# Patient Record
Sex: Male | Born: 1985 | Hispanic: Yes | Marital: Married | Smoking: Current every day smoker
Health system: Southern US, Community
[De-identification: ages and names within clinical notes are randomized; demographics above are authoritative.]

## PROBLEM LIST (undated history)

## (undated) HISTORY — PX: SHOULDER SURGERY: SHX246

---

## 2006-07-24 ENCOUNTER — Emergency Department (HOSPITAL_COMMUNITY): Admission: EM | Admit: 2006-07-24 | Discharge: 2006-07-24 | Payer: Self-pay | Admitting: Emergency Medicine

## 2006-07-30 ENCOUNTER — Ambulatory Visit (HOSPITAL_COMMUNITY): Admission: RE | Admit: 2006-07-30 | Discharge: 2006-07-30 | Payer: Self-pay | Admitting: Orthopedic Surgery

## 2006-11-19 ENCOUNTER — Ambulatory Visit (HOSPITAL_BASED_OUTPATIENT_CLINIC_OR_DEPARTMENT_OTHER): Admission: RE | Admit: 2006-11-19 | Discharge: 2006-11-19 | Payer: Self-pay | Admitting: Orthopedic Surgery

## 2008-12-05 ENCOUNTER — Emergency Department (HOSPITAL_COMMUNITY): Admission: EM | Admit: 2008-12-05 | Discharge: 2008-12-05 | Payer: Self-pay | Admitting: Emergency Medicine

## 2009-01-08 ENCOUNTER — Emergency Department (HOSPITAL_COMMUNITY): Admission: EM | Admit: 2009-01-08 | Discharge: 2009-01-08 | Payer: Self-pay | Admitting: Emergency Medicine

## 2010-09-03 NOTE — Op Note (Signed)
NAMEBRIANA, [name]            ACCOUNT NO.:  [id number]   MEDICAL RECORD NO.:  [id number]          PATIENT TYPE:  AMB   LOCATION:  DSC                          FACILITY:  [facility]   PHYSICIAN:  Rodney A. Mortenson, M.D.DATE OF BIRTH:  03-02-1986   DATE OF PROCEDURE:  11/19/2006  DATE OF DISCHARGE:                               OPERATIVE REPORT   JUSTIFICATION:  25 year old male sustained a comminuted fractures of his  right distal clavicle.  He had a Rockwood pin inserted.  The clavicle  has now healed.  He is here for removal of the pin from the right  clavicle.  The complications were discussed preoperatively.  Questions  were answered and encouraged.   JUSTIFICATION FOR OUTPATIENT SURGERY:  Minimal morbidity.   PREOPERATIVE DIAGNOSIS:  Intramedullary nail right clavicle with healed  fracture clavicle.   POSTOPERATIVE DIAGNOSIS:  Intramedullary nail right clavicle with healed  fracture clavicle.   OPERATION:  Removal intramedullary nail, right clavicle.   SURGEON:  Lenard Galloway. Chaney Malling, M.D.   ANESTHESIA:  General.   PROCEDURE:  The patient was placed on the operating table in the supine  position.  After satisfactory general anesthesia, he was placed in the  semi-sitting position.  The right shoulder was prepped with DuraPrep and  draped out in the usual manner.  The C-arm was used.  Incision was made  in the posterior aspect of the shoulder where the IM rod exited the  posterior aspect of the clavicle.  The C-arm was used to identify and  locate the tip of the IM rod.  Dissection was carried down to this  level.  Bleeders were coagulated.  The IM rod was then found and the T-  handled extractor was applied.  The pin was then backed out its full  length without complications.  The subcutaneous tissue was closed with 2-  0 Vicryl and the skin closed with stainless steel staples.  A sterile  dressing was applied.  The patient returned to the recovery room in  excellent  condition.  Technically, he did extremely well.   DISPOSITION:  1. Darvocet for pain.  2. Usual postop instructions.  3. Return to my office on Wednesday.           ______________________________  Lenard Galloway. Chaney Malling, M.D.     RAM/MEDQ  D:  11/19/2006  T:  11/19/2006  Job:  [id number]

## 2010-09-06 NOTE — Op Note (Signed)
[name], [name]            ACCOUNT NO.:  [id number]   MEDICAL RECORD NO.:  [id number]          PATIENT TYPE:  AMB   LOCATION:  SDS                          FACILITY:  [facility]   PHYSICIAN:  Rodney A. Mortenson, M.D.DATE OF BIRTH:  [birth year]   DATE OF PROCEDURE:  07/30/2006  DATE OF DISCHARGE:                               OPERATIVE REPORT   PREOPERATIVE DIAGNOSIS:  Mid shaft clavicle fracture, right, with  significant displacement.   POSTOPERATIVE DIAGNOSIS:  Mid shaft clavicle fracture, right, with  significant displacement.   OPERATION:  Open reduction of fracture, right clavicle using a Rockwood  3-mm pin.   SURGEON:  Lenard Galloway. Chaney Malling, M.D.   ASSISTANT:  Legrand Pitts. Duffy, P.A.   PROCEDURE:  The patient was placed on the operative table in the supine  position.  After satisfactory general anesthesia, the patient was placed  in a semiseated position.  The right shoulder and upper extremity was  prepped with DuraPrep and draped out in the usual manner.  An incision  was made over the fracture site, following along his lines.  The skin  edges were retracted.  A small self-retaining retractor was put in  place.  The piriformis muscles were stripped off an split with the  fingers.  A small nerve was seen crossing the field and was protected  throughout the procedure.  The medial fragment of the clavicle was found  and a small soft tissue elevator was used to free up the medial end to  this large butterfly fragment attached to the lateral portion of the  clavicle, which was deep.  A small clamp was raised about the medial  fragment.  This was drilled with a small drill and then this is  threaded.  Once this was accomplished to my satisfaction, the proximal  portion of the clavicle was tapped using the large shaped tapping  device.  Attention was then turned to the lateral fragment.  This was  freed up with a small periosteal elevator.  A clamp was placed about the  lateral  fragment and brought up into the wound.  At this point, a drill  was passed laterally and also pushed though lateral cortex of the  lateral clavicle fragment.  The Rockwood pin 3 mm in diameter was then  selected and driven down the lateral clavicle fragment up the  posterolateral side through the previously drilled hole and was brought  out through the skin.  A small stab wound was made.  The pin had been  backed out so the large threads were in the lateral fragment.  The  fracture was reduced and this Rockwood pin was advanced medially across  the fracture site.  Compression was applied.  An excellent almost  anatomic reduction was achieved with 2 fragments plus a large butterfly  fragment.  Nuts were placed over the posterior aspect of the pin.  This  was advanced down and the nuts were tightened down and against the  lateral cortex of the clavicle and this had further compression.  This  was also checked with x-rays throughout the procedure.  The Rockwood pin  was then backed out slightly.  The pin cutters were used to cut off the  excess pin and then the Rockwood pin was advanced medially.  Once again,  I applied compression.  This buried the wire deep with lots of soft  tissue and muscle coverage.  I was very pleased with final reduction and  insertion of the pins.  The wound was then irrigated with copious  amounts of  saline solution.  Piriformis muscles were closed with Vicryl.  The 2-0  Vicryl was used to close to subcutaneous tissue and the stainless steel  staples were used to close the skin.  Sterile dressings were applied and  the patient returned to the recovery room in excellent condition.  Technically this went extremely well.           ______________________________  Lenard Galloway. Chaney Malling, M.D.     RAM/MEDQ  D:  07/30/2006  T:  07/30/2006  Job:  [id number]

## 2011-02-03 LAB — POCT HEMOGLOBIN-HEMACUE: Operator id: 123881

## 2012-09-08 ENCOUNTER — Emergency Department (HOSPITAL_COMMUNITY): Payer: Self-pay

## 2012-09-08 ENCOUNTER — Emergency Department (HOSPITAL_COMMUNITY)
Admission: EM | Admit: 2012-09-08 | Discharge: 2012-09-08 | Disposition: A | Discharge status: Home / Self Care | Payer: Self-pay | Attending: Emergency Medicine | Admitting: Emergency Medicine

## 2012-09-08 ENCOUNTER — Encounter (HOSPITAL_COMMUNITY): Payer: Self-pay

## 2012-09-08 DIAGNOSIS — IMO0002 Reserved for concepts with insufficient information to code with codable children: Secondary | ICD-10-CM | POA: Insufficient documentation

## 2012-09-08 DIAGNOSIS — S0990XA Unspecified injury of head, initial encounter: Secondary | ICD-10-CM | POA: Insufficient documentation

## 2012-09-08 DIAGNOSIS — S79919A Unspecified injury of unspecified hip, initial encounter: Secondary | ICD-10-CM | POA: Insufficient documentation

## 2012-09-08 DIAGNOSIS — Y939 Activity, unspecified: Secondary | ICD-10-CM | POA: Insufficient documentation

## 2012-09-08 DIAGNOSIS — S40019A Contusion of unspecified shoulder, initial encounter: Secondary | ICD-10-CM | POA: Insufficient documentation

## 2012-09-08 DIAGNOSIS — S79929A Unspecified injury of unspecified thigh, initial encounter: Secondary | ICD-10-CM | POA: Insufficient documentation

## 2012-09-08 DIAGNOSIS — S0010XA Contusion of unspecified eyelid and periocular area, initial encounter: Secondary | ICD-10-CM | POA: Insufficient documentation

## 2012-09-08 DIAGNOSIS — Z23 Encounter for immunization: Secondary | ICD-10-CM | POA: Insufficient documentation

## 2012-09-08 DIAGNOSIS — S40012A Contusion of left shoulder, initial encounter: Secondary | ICD-10-CM

## 2012-09-08 DIAGNOSIS — S01501A Unspecified open wound of lip, initial encounter: Secondary | ICD-10-CM | POA: Insufficient documentation

## 2012-09-08 DIAGNOSIS — Y929 Unspecified place or not applicable: Secondary | ICD-10-CM | POA: Insufficient documentation

## 2012-09-08 DIAGNOSIS — W11XXXA Fall on and from ladder, initial encounter: Secondary | ICD-10-CM | POA: Insufficient documentation

## 2012-09-08 DIAGNOSIS — Z880 Allergy status to penicillin: Secondary | ICD-10-CM | POA: Insufficient documentation

## 2012-09-08 MED ORDER — TETANUS-DIPHTH-ACELL PERTUSSIS 5-2.5-18.5 LF-MCG/0.5 IM SUSP
0.5000 mL | Freq: Once | INTRAMUSCULAR | Status: AC
  Administered 2012-09-08: 0.5 mL via INTRAMUSCULAR
  Filled 2012-09-08: qty 0.5

## 2012-09-08 MED ORDER — FENTANYL CITRATE 0.05 MG/ML IJ SOLN
100.0000 ug | Freq: Once | INTRAMUSCULAR | Status: AC
  Administered 2012-09-08: 100 ug via INTRAVENOUS
  Filled 2012-09-08: qty 2

## 2012-09-08 MED ORDER — OXYCODONE-ACETAMINOPHEN 5-325 MG PO TABS: 1.0000 | ORAL_TABLET | ORAL | Status: DC | PRN

## 2012-09-08 MED ORDER — NAPROXEN 500 MG PO TABS: 500.0000 mg | ORAL_TABLET | Freq: Two times a day (BID) | ORAL | Status: DC

## 2012-09-08 NOTE — Progress Notes (Signed)
Orthopedic Tech Progress Note Patient Details:  [name] 1985/12/24 [id number]  Patient ID: [name], male   DOB: 11/20/1985, 27 y.o.   MRN: [id number] Made level 2 trauma visit  [name] 09/08/2012, 4:31 PM

## 2012-09-08 NOTE — ED Notes (Signed)
Pt. Arrived via ambulance from a fall from a ladder approximately 15 feel.  Pt. Landed onto his LT. Shoulder area, with swelling, no deformity,. Pt. Also hit rt. Forehead has an abrasion noted to his rt. Eyebrow.  Abrasion to his rt. Lower lip and the top of his lt. Scalp area.  Pt. Also has a hematoma to his rt. Posterior leg.   Pt. Denies any LOC, Pt. Speaks no English, interrupter at the bedside.  Pt. Reports having a sharp pain to his mid thoracic area no visible marks noted.

## 2012-09-08 NOTE — Progress Notes (Signed)
Spoke w/pt and learned he does not speak [place]. Called for house interpreter, Darrelyn Hillock, who said she would come and interpret. Marjory Lies Chaplain  09/08/12 1600  Clinical Encounter Type  Visited With Patient

## 2012-09-08 NOTE — ED Provider Notes (Signed)
History     CSN: [id number]  Arrival date & time 09/08/12  1614   First MD Initiated Contact with Patient 09/08/12 1628      Chief Complaint  Patient presents with  . Fall    (Consider location/radiation/quality/duration/timing/severity/associated sxs/prior treatment) HPI Comments: The patient is a primary Spanish speaking male at 27 years old who fell off a ladder at approximately 15 feet, struck something on the way down possibly ladder, possibly scaffolding, struck his right shoulder on the ground as well as his right leg on the way down on the posterior thigh. He stayed on the ground at the request of his coworkers, paramedics found the patient in the supine position on the ground with a normal mental status complaining of left shoulder pain primarily. Pain is persistent, worse with palpation of the left shoulder and associated with redness bruising and contusion. No loss of consciousness, no blurred vision, no pain in his back or neck. He denies headache.  The history is provided by the patient and the EMS personnel.    History reviewed. No pertinent past medical history.  History reviewed. No pertinent past surgical history.  No family history on file.  History  Substance Use Topics  . Smoking status: Not on file  . Smokeless tobacco: Not on file  . Alcohol Use: Yes     Comment: occasssional      Review of Systems  All other systems reviewed and are negative.    Allergies  Penicillins  Home Medications   Current Outpatient Rx  Name  Route  Sig  Dispense  Refill  . naproxen (NAPROSYN) 500 MG tablet   Oral   Take 1 tablet (500 mg total) by mouth 2 (two) times daily with a meal.   30 tablet   0   . oxyCODONE-acetaminophen (PERCOCET) 5-325 MG per tablet   Oral   Take 1 tablet by mouth every 4 (four) hours as needed for pain.   20 tablet   0     BP 133/82  Pulse 70  Temp(Src) 98.7 F (37.1 C) (Oral)  Resp 9  SpO2 100%  Physical Exam  Nursing note  and vitals reviewed. Constitutional: He appears well-developed and well-nourished. No distress.  HENT:  Head: Normocephalic.  Mouth/Throat: Oropharynx is clear and moist. No oropharyngeal exudate.  Contusion over the left side of the periorbital area, no hematomas, no abrasions or lacerations.  Has small lac to the lower R lip just R of midline.  < 3mm.  Does not involve the vermillion border.  Eyes: Conjunctivae and EOM are normal. Pupils are equal, round, and reactive to light. Right eye exhibits no discharge. Left eye exhibits no discharge. No scleral icterus.  Neck: Normal range of motion. Neck supple. No JVD present. No thyromegaly present.  Cardiovascular: Normal rate, regular rhythm, normal heart sounds and intact distal pulses.  Exam reveals no gallop and no friction rub.   No murmur heard. Pulmonary/Chest: Effort normal and breath sounds normal. No respiratory distress. He has no wheezes. He has no rales. He exhibits no tenderness.  No tenderness over the chest wall  Abdominal: Soft. Bowel sounds are normal. He exhibits no distension and no mass. There is no tenderness.  Musculoskeletal: Normal range of motion. He exhibits tenderness ( The posterior right thigh, associated with bruising at that site, tenderness to the left shoulder associated with bruising and abrasions. Tenderness over the posterior left shoulder over the scapula.). He exhibits no edema.  Good range of motion  of the bilateral lower extremities with normal straight leg raise bilaterally, no tenderness over the major joints, normal range of motion at the upper extremities except for the left shoulder with mild decreased range of motion but no crepitance or obvious deformity.  Lymphadenopathy:    He has no cervical adenopathy.  Neurological: He is alert. Coordination normal.  Follow commands without difficulty  Skin: Skin is warm and dry. No rash noted. There is erythema.  Psychiatric: He has a normal mood and affect. His  behavior is normal.    ED Course  Procedures (including critical care time)  Labs Reviewed - No data to display Dg Lumbar Spine Complete  09/08/2012   *RADIOLOGY REPORT*  Clinical Data: Trauma, fall off ladder 15 feet, back pain  LUMBAR SPINE - COMPLETE 4+ VIEW  Comparison: None.  Findings: Five lumbar-type vertebral bodies.  Normal lumbar lordosis.  No evidence of fracture dislocation.  Vertebral body heights and intervertebral disc spaces are maintained.  Visualized bony pelvis appears intact.  IMPRESSION: No fracture or dislocation is seen.   Original Report Authenticated By: Charline Bills, M.D.   Dg Scapula Left  09/08/2012   *RADIOLOGY REPORT*  Clinical Data: Fall 15 feet off ladder, left shoulder/scapular pain  LEFT SCAPULA - 2+ VIEWS  Comparison: None.  Findings: No scapular fracture is seen.  Visualized left lung is clear.  IMPRESSION: No scapular fracture is seen.   Original Report Authenticated By: Charline Bills, M.D.   Dg Femur Right  09/08/2012   *RADIOLOGY REPORT*  Clinical Data: Fall off ladder 15 feet, right posterior femur pain  RIGHT FEMUR - 2 VIEW  Comparison: None.  Findings: No fracture or dislocation is seen.  The joint spaces are preserved.  The visualized soft tissues are unremarkable.  No suprapatellar knee joint effusion.  IMPRESSION: No fracture or dislocation is seen.   Original Report Authenticated By: Charline Bills, M.D.   Ct Head Wo Contrast  09/08/2012   *RADIOLOGY REPORT*  Clinical Data:  Trauma  CT HEAD WITHOUT CONTRAST CT CERVICAL SPINE WITHOUT CONTRAST  Technique:  Multidetector CT imaging of the head and cervical spine was performed following the standard protocol without intravenous contrast.  Multiplanar CT image reconstructions of the cervical spine were also generated.  Comparison:   None  CT HEAD  Findings: Ventricle size is normal.  Negative for intracranial hemorrhage, mass, or infarct.  The brain appears normal.  Negative for skull fracture.   IMPRESSION: Normal  CT CERVICAL SPINE  Findings: Negative for fracture or mass.  Normal alignment and no degenerative change.  IMPRESSION: Normal   Original Report Authenticated By: Janeece Riggers, M.D.   Ct Cervical Spine Wo Contrast  09/08/2012   *RADIOLOGY REPORT*  Clinical Data:  Trauma  CT HEAD WITHOUT CONTRAST CT CERVICAL SPINE WITHOUT CONTRAST  Technique:  Multidetector CT imaging of the head and cervical spine was performed following the standard protocol without intravenous contrast.  Multiplanar CT image reconstructions of the cervical spine were also generated.  Comparison:   None  CT HEAD  Findings: Ventricle size is normal.  Negative for intracranial hemorrhage, mass, or infarct.  The brain appears normal.  Negative for skull fracture.  IMPRESSION: Normal  CT CERVICAL SPINE  Findings: Negative for fracture or mass.  Normal alignment and no degenerative change.  IMPRESSION: Normal   Original Report Authenticated By: Janeece Riggers, M.D.   Dg Shoulder Left  09/08/2012   *RADIOLOGY REPORT*  Clinical Data: Larey Seat 15 feet from ladder landing on left  side, left shoulder and scapular pain, back pain  LEFT SHOULDER - 2+ VIEW  Comparison: None  Findings: Osseous mineralization normal. No glenohumeral fracture or dislocation. Question malalignment at the [facility] joint, without fracture questionably slightly inferiorly displaced. No definite clavicular fracture is identified. Visualized left ribs intact.  IMPRESSION: Question slight malalignment at the left [facility] joint versus projectional artifact; if there is clinical concern for [facility] joint injury, consider dedicated [facility] joint radiographs.   Original Report Authenticated By: Ulyses Southward, M.D.     1. Contusion of left shoulder, initial encounter   2. Minor head injury, initial encounter       MDM  Leg injuries including right leg, left shoulder, possibly head injury. There is no tenderness over the cervical thoracic or lumbar spines but given the mechanism of head  injury will image head and cervical spine in addition to the left shoulder and the right femur. Pain medications ordered.  Imaging shows no signs of fractures, possible a.c. joint injury, will have followup with orthopedics. Sling placed, pain medications given.      Vida Roller, MD 09/08/12 [phone]

## 2014-01-30 ENCOUNTER — Encounter (HOSPITAL_COMMUNITY): Payer: Self-pay | Admitting: Emergency Medicine

## 2014-01-30 ENCOUNTER — Emergency Department (HOSPITAL_COMMUNITY)
Admission: EM | Admit: 2014-01-30 | Discharge: 2014-01-30 | Disposition: A | Discharge status: Home / Self Care | Payer: Self-pay | Attending: Emergency Medicine | Admitting: Emergency Medicine

## 2014-01-30 DIAGNOSIS — Z880 Allergy status to penicillin: Secondary | ICD-10-CM | POA: Insufficient documentation

## 2014-01-30 DIAGNOSIS — R000 Tachycardia, unspecified: Secondary | ICD-10-CM | POA: Insufficient documentation

## 2014-01-30 DIAGNOSIS — F10129 Alcohol abuse with intoxication, unspecified: Secondary | ICD-10-CM | POA: Insufficient documentation

## 2014-01-30 DIAGNOSIS — F10929 Alcohol use, unspecified with intoxication, unspecified: Secondary | ICD-10-CM

## 2014-01-30 LAB — CBC WITH DIFFERENTIAL/PLATELET
Basophils Absolute: 0 10*3/uL (ref 0.0–0.1)
Basophils Relative: 1 % (ref 0–1)
EOS PCT: 0 % (ref 0–5)
Eosinophils Absolute: 0 10*3/uL (ref 0.0–0.7)
HEMATOCRIT: 42.4 % (ref 39.0–52.0)
Hemoglobin: 14.7 g/dL (ref 13.0–17.0)
LYMPHS ABS: 1.8 10*3/uL (ref 0.7–4.0)
LYMPHS PCT: 20 % (ref 12–46)
MCH: 30.3 pg (ref 26.0–34.0)
MCHC: 34.7 g/dL (ref 30.0–36.0)
MCV: 87.4 fL (ref 78.0–100.0)
Monocytes Absolute: 0.4 10*3/uL (ref 0.1–1.0)
Monocytes Relative: 5 % (ref 3–12)
Neutro Abs: 6.6 10*3/uL (ref 1.7–7.7)
Neutrophils Relative %: 74 % (ref 43–77)
PLATELETS: 303 10*3/uL (ref 150–400)
RBC: 4.85 MIL/uL (ref 4.22–5.81)
RDW: 13.1 % (ref 11.5–15.5)
WBC: 8.9 10*3/uL (ref 4.0–10.5)

## 2014-01-30 LAB — BASIC METABOLIC PANEL
Anion gap: 18 — ABNORMAL HIGH (ref 5–15)
BUN: 8 mg/dL (ref 6–23)
CALCIUM: 9 mg/dL (ref 8.4–10.5)
CHLORIDE: 104 meq/L (ref 96–112)
CO2: 20 meq/L (ref 19–32)
Creatinine, Ser: 0.93 mg/dL (ref 0.50–1.35)
GFR calc Af Amer: >90 mL/min (ref >90)
GFR calc non Af Amer: >90 mL/min (ref >90)
GLUCOSE: 141 mg/dL — AB (ref 70–99)
Potassium: 3.4 mEq/L — ABNORMAL LOW (ref 3.7–5.3)
SODIUM: 142 meq/L (ref 137–147)

## 2014-01-30 LAB — RAPID URINE DRUG SCREEN, HOSP PERFORMED
AMPHETAMINES: NOT DETECTED
Barbiturates: NOT DETECTED
Benzodiazepines: NOT DETECTED
Cocaine: POSITIVE — AB
Opiates: NOT DETECTED
Tetrahydrocannabinol: NOT DETECTED

## 2014-01-30 LAB — ETHANOL: ALCOHOL ETHYL (B): 425 mg/dL — AB (ref 0–11)

## 2014-01-30 MED ORDER — THIAMINE HCL 100 MG/ML IJ SOLN
Freq: Once | INTRAVENOUS | Status: AC
  Administered 2014-01-30: 02:00:00 via INTRAVENOUS
  Filled 2014-01-30: qty 1000

## 2014-01-30 MED ORDER — ONDANSETRON HCL 4 MG/2ML IJ SOLN
INTRAMUSCULAR | Status: AC
  Administered 2014-01-30: 4 mg via INTRAVENOUS
  Filled 2014-01-30: qty 2

## 2014-01-30 MED ORDER — SODIUM CHLORIDE 0.9 % IV BOLUS (SEPSIS)
1000.0000 mL | Freq: Once | INTRAVENOUS | Status: AC
  Administered 2014-01-30: 1000 mL via INTRAVENOUS

## 2014-01-30 MED ORDER — ONDANSETRON HCL 4 MG/2ML IJ SOLN
4.0000 mg | Freq: Once | INTRAMUSCULAR | Status: AC
  Administered 2014-01-30: 4 mg via INTRAVENOUS

## 2014-01-30 NOTE — ED Notes (Signed)
Pt ambulated to restroom with unsteady gait. Dr. Wilkie AyeHorton aware

## 2014-01-30 NOTE — ED Notes (Signed)
Resting on monitor. Alert, NAD, calm, VSS. Eating happy meal.

## 2014-01-30 NOTE — ED Notes (Signed)
Pt resting/ sleeping, NAD, calm, airway intact, no dyspnea noted. VSS. HOB 30 degrees, suction and O2 available.

## 2014-01-30 NOTE — ED Notes (Signed)
Remains alert, NAD, eyes open, no dyspnea, resistant to care, MAEx4 (strong), no dyspnea, resting when unprovoked, leaning toward and over side rails, not following directions or answering questions (English or Spanish),pt verbal but unintelligible, smells of ETOH,  no complaints verbalized. VSS. No emesis or secretions. Placed in 4 point restraints (gurney cuffs). Blood sent to lab. IVF w.o. to gravity.

## 2014-01-30 NOTE — ED Notes (Signed)
Attempted 2nd IV site unsuccessful x2

## 2014-01-30 NOTE — ED Notes (Signed)
Pt rolled into room on ED stretcher. Pt brought in by friends by POV to front door. Pt reportedly has been drinking all day. Awake, eyes open, blank stare, not tracking, AMS with decreased LOC, PERRL 6mm sluggish, smells of ETOH, smells of emesis, no signs of emesis, no dyspnea, intermittently flailing arms, swinging, pulling and leaning. Arrives to trauma room C, Dr. Wilkie AyeHorton, RT, RN x3 and EMT present on arrival.

## 2014-01-30 NOTE — ED Notes (Signed)
Pt ambulated to Bathroom

## 2014-01-30 NOTE — ED Provider Notes (Signed)
CSN: [id number]     Arrival date & time 01/30/14  0059 History   First MD Initiated Contact with Patient 01/30/14 0106     Chief Complaint  Patient presents with  . Alcohol Intoxication  . Altered Mental Status     (Consider location/radiation/quality/duration/timing/severity/associated sxs/prior Treatment) HPI  Patient presents with altered mental status. Patient was dropped off by friends. Therefore patient drinks a large amount of alcohol including tequila prior to arrival. They deny knowledge of any other drug use.  On my evaluation, patient is lethargic but arousable, clearly intoxicated. He is noncontributory to history taking. He is moving all 4 extremities and does not appear focal.  Level V caveat for altered mental status  History reviewed. No pertinent past medical history. History reviewed. No pertinent past surgical history. No family history on file. History  Substance Use Topics  . Smoking status: Unknown If Ever Smoked  . Smokeless tobacco: Not on file  . Alcohol Use: Yes     Comment: occasssional    Review of Systems  Unable to perform ROS: Mental status change      Allergies  Penicillins  Home Medications   Prior to Admission medications   Not on File   BP 116/69  Pulse 90  Temp(Src) 97.3 F (36.3 C) (Oral)  Resp 29  Ht 5\' 8"  (1.727 m)  Wt 190 lb (86.183 kg)  BMI 28.90 kg/m2  SpO2 97% Physical Exam  Nursing note and vitals reviewed. Constitutional:  Somnolent but arousable, protecting airway at this time  HENT:  Head: Normocephalic and atraumatic.  Eyes: Pupils are equal, round, and reactive to light.  Pupils 3 mm reactive bilaterally  Neck: Neck supple.  Cardiovascular: Regular rhythm and normal heart sounds.   No murmur heard. Tachycardia  Pulmonary/Chest: Effort normal and breath sounds normal. No respiratory distress. He has no wheezes.  Abdominal: Soft. Bowel sounds are normal. There is no tenderness. There is no rebound.   Musculoskeletal: He exhibits no edema.  Neurological:  Somnolent but arousable, moves all 4 extremities spontaneously  Skin: Skin is warm and dry.  Psychiatric: He has a normal mood and affect.    ED Course  Procedures (including critical care time) Labs Review Labs Reviewed  ETHANOL - Abnormal; Notable for the following:    Alcohol, Ethyl (B) 425 (*)    All other components within normal limits  URINE RAPID DRUG SCREEN (HOSP PERFORMED) - Abnormal; Notable for the following:    Cocaine POSITIVE (*)    All other components within normal limits  BASIC METABOLIC PANEL - Abnormal; Notable for the following:    Potassium 3.4 (*)    Glucose, Bld 141 (*)    Anion gap 18 (*)    All other components within normal limits  CBC WITH DIFFERENTIAL    Imaging Review No results found.   EKG Interpretation None      MDM   Final diagnoses:  Alcohol intoxication, with unspecified complication    Patient presents with altered mental status. Given the history provided by friends, may be secondary to alcohol intoxication. He is nonfocal on exam.  Notable for UDS positive for cocaine and alcohol level of 425. Patient was given fluids given his tachycardia and a banana bag.  Approximately 6 hours after arrival, patient was reevaluated. Mental status has improved.  He is somnolent but arousable and conversant. He is able to tolerate food. He has friends at the bedside. Patient ambulated independently. Given that he has a safe ride  home, so patient is safe for discharge.    Shon Batonourtney F Horton, MD 01/30/14 [phone]

## 2014-01-30 NOTE — ED Notes (Signed)
Pt sleeping in no apparent distress.

## 2014-01-30 NOTE — Discharge Instructions (Signed)
Alcohol Intoxication  Alcohol intoxication occurs when the amount of alcohol that a person has consumed impairs his or her ability to mentally and physically function. Alcohol directly impairs the normal chemical activity of the brain. Drinking large amounts of alcohol can lead to changes in mental function and behavior, and it can cause many physical effects that can be harmful.   Alcohol intoxication can range in severity from mild to very severe. Various factors can affect the level of intoxication that occurs, such as the person's age, gender, weight, frequency of alcohol consumption, and the presence of other medical conditions (such as diabetes, seizures, or heart conditions). Dangerous levels of alcohol intoxication may occur when people drink large amounts of alcohol in a short period (binge drinking). Alcohol can also be especially dangerous when combined with certain prescription medicines or "recreational" drugs.  SIGNS AND SYMPTOMS  Some common signs and symptoms of mild alcohol intoxication include:  · Loss of coordination.  · Changes in mood and behavior.  · Impaired judgment.  · Slurred speech.  As alcohol intoxication progresses to more severe levels, other signs and symptoms will appear. These may include:  · Vomiting.  · Confusion and impaired memory.  · Slowed breathing.  · Seizures.  · Loss of consciousness.  DIAGNOSIS   Your health care provider will take a medical history and perform a physical exam. You will be asked about the amount and type of alcohol you have consumed. Blood tests will be done to measure the concentration of alcohol in your blood. In many places, your blood alcohol level must be lower than 80 mg/dL (0.08%) to legally drive. However, many dangerous effects of alcohol can occur at much lower levels.   TREATMENT   People with alcohol intoxication often do not require treatment. Most of the effects of alcohol intoxication are temporary, and they go away as the alcohol naturally  leaves the body. Your health care provider will monitor your condition until you are stable enough to go home. Fluids are sometimes given through an IV access tube to help prevent dehydration.   HOME CARE INSTRUCTIONS  · Do not drive after drinking alcohol.  · Stay hydrated. Drink enough water and fluids to keep your urine clear or pale yellow. Avoid caffeine.    · Only take over-the-counter or prescription medicines as directed by your health care provider.    SEEK MEDICAL CARE IF:   · You have persistent vomiting.    · You do not feel better after a few days.  · You have frequent alcohol intoxication. Your health care provider can help determine if you should see a substance use treatment counselor.  SEEK IMMEDIATE MEDICAL CARE IF:   · You become shaky or tremble when you try to stop drinking.    · You shake uncontrollably (seizure).    · You throw up (vomit) blood. This may be bright red or may look like black coffee grounds.    · You have blood in your stool. This may be bright red or may appear as a black, tarry, bad smelling stool.    · You become lightheaded or faint.    MAKE SURE YOU:   · Understand these instructions.  · Will watch your condition.  · Will get help right away if you are not doing well or get worse.  Document Released: 01/15/2005 Document Revised: 12/08/2012 Document Reviewed: 09/10/2012  Exit[employer]® Patient Information ©2015 [employer]. This information is not intended to replace advice given to you by your health care provider. Make sure   you discuss any questions you have with your health care provider.

## 2014-01-30 NOTE — ED Notes (Addendum)
No changes, resting, sleeping, NAD, calm, [facility]

## 2014-01-30 NOTE — ED Notes (Signed)
Call [name] [phone] when patient is to be discharged.

## 2014-01-30 NOTE — ED Notes (Signed)
Pt resting with eyes open, NAD, calm, tracking, interactive, (denies: pain, sob, nausea), admits to dizziness. IVF continue to infuse. Restraints remain off.

## 2014-01-30 NOTE — ED Notes (Signed)
Pt resting/ sleeping, NAD, calm, offered urinal, no response, IVF infusing w/o to gravity, yellow socks and arm band remain, VSS.

## 2015-11-11 ENCOUNTER — Encounter (HOSPITAL_COMMUNITY): Payer: Self-pay | Admitting: *Deleted

## 2015-11-11 ENCOUNTER — Emergency Department (HOSPITAL_COMMUNITY)
Admission: EM | Admit: 2015-11-11 | Discharge: 2015-11-11 | Disposition: A | Discharge status: Other Acute Inpatient Hospital | Payer: Self-pay | Attending: Emergency Medicine | Admitting: Emergency Medicine

## 2015-11-11 ENCOUNTER — Encounter (HOSPITAL_COMMUNITY): Payer: Self-pay | Admitting: Emergency Medicine

## 2015-11-11 ENCOUNTER — Observation Stay (HOSPITAL_COMMUNITY)
Admission: AD | Admit: 2015-11-11 | Discharge: 2015-11-13 | Disposition: A | Discharge status: Home / Self Care | Payer: Self-pay | Source: Intra-hospital | Attending: Psychiatry | Admitting: Psychiatry

## 2015-11-11 ENCOUNTER — Emergency Department (HOSPITAL_COMMUNITY): Payer: Self-pay

## 2015-11-11 DIAGNOSIS — E872 Acidosis, unspecified: Secondary | ICD-10-CM

## 2015-11-11 DIAGNOSIS — E876 Hypokalemia: Secondary | ICD-10-CM | POA: Insufficient documentation

## 2015-11-11 DIAGNOSIS — F1020 Alcohol dependence, uncomplicated: Secondary | ICD-10-CM | POA: Diagnosis present

## 2015-11-11 DIAGNOSIS — IMO0002 Reserved for concepts with insufficient information to code with codable children: Secondary | ICD-10-CM | POA: Diagnosis present

## 2015-11-11 DIAGNOSIS — Y9302 Activity, running: Secondary | ICD-10-CM | POA: Insufficient documentation

## 2015-11-11 DIAGNOSIS — Y92009 Unspecified place in unspecified non-institutional (private) residence as the place of occurrence of the external cause: Secondary | ICD-10-CM | POA: Insufficient documentation

## 2015-11-11 DIAGNOSIS — T148XXA Other injury of unspecified body region, initial encounter: Secondary | ICD-10-CM

## 2015-11-11 DIAGNOSIS — W2209XA Striking against other stationary object, initial encounter: Secondary | ICD-10-CM | POA: Insufficient documentation

## 2015-11-11 DIAGNOSIS — Y999 Unspecified external cause status: Secondary | ICD-10-CM | POA: Insufficient documentation

## 2015-11-11 DIAGNOSIS — Z880 Allergy status to penicillin: Secondary | ICD-10-CM | POA: Insufficient documentation

## 2015-11-11 DIAGNOSIS — F329 Major depressive disorder, single episode, unspecified: Secondary | ICD-10-CM | POA: Insufficient documentation

## 2015-11-11 DIAGNOSIS — R42 Dizziness and giddiness: Secondary | ICD-10-CM | POA: Insufficient documentation

## 2015-11-11 DIAGNOSIS — F10229 Alcohol dependence with intoxication, unspecified: Principal | ICD-10-CM | POA: Insufficient documentation

## 2015-11-11 DIAGNOSIS — R45851 Suicidal ideations: Secondary | ICD-10-CM | POA: Insufficient documentation

## 2015-11-11 DIAGNOSIS — Y905 Blood alcohol level of 100-119 mg/100 ml: Secondary | ICD-10-CM | POA: Insufficient documentation

## 2015-11-11 DIAGNOSIS — S61411A Laceration without foreign body of right hand, initial encounter: Secondary | ICD-10-CM | POA: Insufficient documentation

## 2015-11-11 DIAGNOSIS — Z23 Encounter for immunization: Secondary | ICD-10-CM | POA: Insufficient documentation

## 2015-11-11 DIAGNOSIS — F1090 Alcohol use, unspecified, uncomplicated: Secondary | ICD-10-CM | POA: Diagnosis present

## 2015-11-11 DIAGNOSIS — S60221A Contusion of right hand, initial encounter: Secondary | ICD-10-CM

## 2015-11-11 LAB — COMPREHENSIVE METABOLIC PANEL
ALBUMIN: 4 g/dL (ref 3.5–5.0)
ALK PHOS: 74 U/L (ref 38–126)
ALK PHOS: 80 U/L (ref 38–126)
ALT: 13 U/L — ABNORMAL LOW (ref 17–63)
ALT: 14 U/L — AB (ref 17–63)
ANION GAP: 5 (ref 5–15)
AST: 15 U/L (ref 15–41)
AST: 20 U/L (ref 15–41)
Albumin: 3.7 g/dL (ref 3.5–5.0)
Anion gap: 13 (ref 5–15)
BILIRUBIN TOTAL: 0.2 mg/dL — AB (ref 0.3–1.2)
BILIRUBIN TOTAL: 0.4 mg/dL (ref 0.3–1.2)
BUN: 10 mg/dL (ref 6–20)
BUN: 8 mg/dL (ref 6–20)
CALCIUM: 8 mg/dL — AB (ref 8.9–10.3)
CALCIUM: 8.5 mg/dL — AB (ref 8.9–10.3)
CO2: 13 mmol/L — AB (ref 22–32)
CO2: 19 mmol/L — ABNORMAL LOW (ref 22–32)
CREATININE: 1.04 mg/dL (ref 0.61–1.24)
Chloride: 115 mmol/L — ABNORMAL HIGH (ref 101–111)
Chloride: 117 mmol/L — ABNORMAL HIGH (ref 101–111)
Creatinine, Ser: 0.81 mg/dL (ref 0.61–1.24)
GFR calc Af Amer: >60 mL/min (ref >60)
GFR calc non Af Amer: >60 mL/min (ref >60)
GFR calc non Af Amer: >60 mL/min (ref >60)
GLUCOSE: 100 mg/dL — AB (ref 65–99)
Glucose, Bld: 104 mg/dL — ABNORMAL HIGH (ref 65–99)
Potassium: 2.9 mmol/L — ABNORMAL LOW (ref 3.5–5.1)
Potassium: 3.2 mmol/L — ABNORMAL LOW (ref 3.5–5.1)
SODIUM: 141 mmol/L (ref 135–145)
Sodium: 141 mmol/L (ref 135–145)
TOTAL PROTEIN: 6.3 g/dL — AB (ref 6.5–8.1)
TOTAL PROTEIN: 7.2 g/dL (ref 6.5–8.1)

## 2015-11-11 LAB — I-STAT CG4 LACTIC ACID, ED: LACTIC ACID, VENOUS: 1.86 mmol/L (ref 0.5–1.9)

## 2015-11-11 LAB — CBC
HEMATOCRIT: 37.1 % — AB (ref 39.0–52.0)
Hemoglobin: 13.1 g/dL (ref 13.0–17.0)
MCH: 30.3 pg (ref 26.0–34.0)
MCHC: 35.3 g/dL (ref 30.0–36.0)
MCV: 85.7 fL (ref 78.0–100.0)
PLATELETS: 318 10*3/uL (ref 150–400)
RBC: 4.33 MIL/uL (ref 4.22–5.81)
RDW: 13.2 % (ref 11.5–15.5)
WBC: 6.5 10*3/uL (ref 4.0–10.5)

## 2015-11-11 LAB — ETHANOL: Alcohol, Ethyl (B): 101 mg/dL — ABNORMAL HIGH (ref <5)

## 2015-11-11 LAB — VOLATILES,BLD-ACETONE,ETHANOL,ISOPROP,METHANOL
Acetone, blood: NEGATIVE
ETHANOL, BLOOD: 0.04
ISOPROPANOL, BLOOD: NEGATIVE
Methanol, blood: NEGATIVE

## 2015-11-11 LAB — ACETAMINOPHEN LEVEL: Acetaminophen (Tylenol), Serum: <10 ug/mL — ABNORMAL LOW (ref 10–30)

## 2015-11-11 LAB — ETHYLENE GLYCOL: ETHYLENE GLYCOL LVL: NOT DETECTED mg/dL (ref <5)

## 2015-11-11 LAB — SALICYLATE LEVEL: Salicylate Lvl: <4 mg/dL (ref 2.8–30.0)

## 2015-11-11 MED ORDER — LORAZEPAM 1 MG PO TABS: 1.0000 mg | ORAL_TABLET | Freq: Four times a day (QID) | ORAL | Status: DC | PRN

## 2015-11-11 MED ORDER — TETANUS-DIPHTH-ACELL PERTUSSIS 5-2.5-18.5 LF-MCG/0.5 IM SUSP
0.5000 mL | Freq: Once | INTRAMUSCULAR | Status: AC
  Administered 2015-11-11: 0.5 mL via INTRAMUSCULAR
  Filled 2015-11-11: qty 0.5

## 2015-11-11 MED ORDER — HYDROXYZINE HCL 25 MG PO TABS: 25.0000 mg | ORAL_TABLET | Freq: Four times a day (QID) | ORAL | Status: DC | PRN

## 2015-11-11 MED ORDER — POTASSIUM CHLORIDE 10 MEQ/100ML IV SOLN
INTRAVENOUS | Status: AC
  Filled 2015-11-11: qty 200

## 2015-11-11 MED ORDER — POTASSIUM CHLORIDE CRYS ER 20 MEQ PO TBCR
60.0000 meq | EXTENDED_RELEASE_TABLET | Freq: Once | ORAL | Status: AC
  Administered 2015-11-11: 60 meq via ORAL

## 2015-11-11 MED ORDER — VITAMIN B-1 100 MG PO TABS
100.0000 mg | ORAL_TABLET | Freq: Every day | ORAL | Status: DC
  Administered 2015-11-12 – 2015-11-13 (×2): 100 mg via ORAL
  Filled 2015-11-11 (×2): qty 1

## 2015-11-11 MED ORDER — ACETAMINOPHEN 325 MG PO TABS: 650.0000 mg | ORAL_TABLET | Freq: Four times a day (QID) | ORAL | Status: DC | PRN

## 2015-11-11 MED ORDER — LORAZEPAM 1 MG PO TABS
1.0000 mg | ORAL_TABLET | Freq: Four times a day (QID) | ORAL | Status: DC
  Administered 2015-11-11 (×2): 1 mg via ORAL
  Filled 2015-11-11 (×3): qty 1

## 2015-11-11 MED ORDER — LORAZEPAM 1 MG PO TABS: 1.0000 mg | ORAL_TABLET | Freq: Two times a day (BID) | ORAL | Status: DC

## 2015-11-11 MED ORDER — ONDANSETRON 4 MG PO TBDP: 4.0000 mg | ORAL_TABLET | Freq: Four times a day (QID) | ORAL | Status: DC | PRN

## 2015-11-11 MED ORDER — ADULT MULTIVITAMIN W/MINERALS CH
1.0000 | ORAL_TABLET | Freq: Every day | ORAL | Status: DC
  Administered 2015-11-11 – 2015-11-13 (×3): 1 via ORAL
  Filled 2015-11-11: qty 7
  Filled 2015-11-11 (×2): qty 1

## 2015-11-11 MED ORDER — LOPERAMIDE HCL 2 MG PO CAPS: 2.0000 mg | ORAL_CAPSULE | ORAL | Status: DC | PRN

## 2015-11-11 MED ORDER — MAGNESIUM HYDROXIDE 400 MG/5ML PO SUSP: 30.0000 mL | Freq: Every day | ORAL | Status: DC | PRN

## 2015-11-11 MED ORDER — LORAZEPAM 1 MG PO TABS: 1.0000 mg | ORAL_TABLET | Freq: Three times a day (TID) | ORAL | Status: DC

## 2015-11-11 MED ORDER — POTASSIUM CHLORIDE CRYS ER 20 MEQ PO TBCR
EXTENDED_RELEASE_TABLET | ORAL | Status: AC
  Filled 2015-11-11: qty 4

## 2015-11-11 MED ORDER — TRAZODONE HCL 50 MG PO TABS
50.0000 mg | ORAL_TABLET | Freq: Every evening | ORAL | Status: DC | PRN
  Filled 2015-11-11: qty 7

## 2015-11-11 MED ORDER — CHLORDIAZEPOXIDE HCL 25 MG PO CAPS: 25.0000 mg | ORAL_CAPSULE | Freq: Four times a day (QID) | ORAL | Status: DC | PRN

## 2015-11-11 MED ORDER — VITAMIN B-1 100 MG PO TABS
100.0000 mg | ORAL_TABLET | Freq: Every day | ORAL | Status: DC
  Administered 2015-11-11: 100 mg via ORAL
  Filled 2015-11-11: qty 1

## 2015-11-11 MED ORDER — POTASSIUM CHLORIDE 10 MEQ/100ML IV SOLN: 10.0000 meq | INTRAVENOUS | Status: AC

## 2015-11-11 MED ORDER — LORAZEPAM 1 MG PO TABS: 1.0000 mg | ORAL_TABLET | Freq: Every day | ORAL | Status: DC

## 2015-11-11 MED ORDER — ALUM & MAG HYDROXIDE-SIMETH 200-200-20 MG/5ML PO SUSP: 30.0000 mL | ORAL | Status: DC | PRN

## 2015-11-11 MED ORDER — THIAMINE HCL 100 MG/ML IJ SOLN: 100.0000 mg | Freq: Once | INTRAMUSCULAR | Status: DC

## 2015-11-11 MED ORDER — ADULT MULTIVITAMIN W/MINERALS CH
1.0000 | ORAL_TABLET | Freq: Every day | ORAL | Status: DC
  Administered 2015-11-11: 1 via ORAL
  Filled 2015-11-11: qty 1

## 2015-11-11 NOTE — ED Provider Notes (Signed)
Patient care assumed from [facility], PA-C at shift change. Please see her note for further.  Briefly, the patient presented with suicidal ideations and hand pain after punching a wall. Patient had mild hypokalemia with potassium of 2.9. He also had a normal anion gap acidosis with a bicarbonate of 13. Plan at shift change was to recheck metabolic panel and have TTS consult. Patient had potassium and fluids. Second CMP reveals an improved potassium at 3.2. Bicarbonate has improved to 19. Normal anion gap. I discussed these findings with Dr. Audie Pinto who feels the patient can be medically cleared. Blood vault tiles and ethyl glycol were also negative.  Patient met inpatient criteria for psychiatric admission. Patient transferred to behavioral health.  Results for orders placed or performed during the hospital encounter of 11/11/15  Comprehensive metabolic panel  Result Value Ref Range   Sodium 141 135 - 145 mmol/L   Potassium 2.9 (L) 3.5 - 5.1 mmol/L   Chloride 115 (H) 101 - 111 mmol/L   CO2 13 (L) 22 - 32 mmol/L   Glucose, Bld 100 (H) 65 - 99 mg/dL   BUN 10 6 - 20 mg/dL   Creatinine, Ser 1.04 0.61 - 1.24 mg/dL   Calcium 8.5 (L) 8.9 - 10.3 mg/dL   Total Protein 7.2 6.5 - 8.1 g/dL   Albumin 4.0 3.5 - 5.0 g/dL   AST 20 15 - 41 U/L   ALT 14 (L) 17 - 63 U/L   Alkaline Phosphatase 80 38 - 126 U/L   Total Bilirubin 0.2 (L) 0.3 - 1.2 mg/dL   GFR calc non Af Amer >60 >60 mL/min   GFR calc Af Amer >60 >60 mL/min   Anion gap 13 5 - 15  Ethanol  Result Value Ref Range   Alcohol, Ethyl (B) 101 (H) <5 mg/dL  Salicylate level  Result Value Ref Range   Salicylate Lvl <1.6 2.8 - 30.0 mg/dL  Acetaminophen level  Result Value Ref Range   Acetaminophen (Tylenol), Serum <10 (L) 10 - 30 ug/mL  cbc  Result Value Ref Range   WBC 6.5 4.0 - 10.5 K/uL   RBC 4.33 4.22 - 5.81 MIL/uL   Hemoglobin 13.1 13.0 - 17.0 g/dL   HCT 37.1 (L) 39.0 - 52.0 %   MCV 85.7 78.0 - 100.0 fL   MCH 30.3 26.0 - 34.0 pg   MCHC 35.3 30.0 - 36.0 g/dL   RDW 13.2 11.5 - 15.5 %   Platelets 318 150 - 400 K/uL  Ethylene glycol  Result Value Ref Range   Ethylene Glycol Lvl NONE DETECTED <5 mg/dL  Volatiles,Blood (acetone,ethanol,isoprop,methanol)  Result Value Ref Range   Acetone, blood NEGATIVE    Ethanol, blood 0.040    Isopropanol, blood NEGATIVE    Methanol, blood NEGATIVE   Comprehensive metabolic panel  Result Value Ref Range   Sodium 141 135 - 145 mmol/L   Potassium 3.2 (L) 3.5 - 5.1 mmol/L   Chloride 117 (H) 101 - 111 mmol/L   CO2 19 (L) 22 - 32 mmol/L   Glucose, Bld 104 (H) 65 - 99 mg/dL   BUN 8 6 - 20 mg/dL   Creatinine, Ser 0.81 0.61 - 1.24 mg/dL   Calcium 8.0 (L) 8.9 - 10.3 mg/dL   Total Protein 6.3 (L) 6.5 - 8.1 g/dL   Albumin 3.7 3.5 - 5.0 g/dL   AST 15 15 - 41 U/L   ALT 13 (L) 17 - 63 U/L   Alkaline Phosphatase 74 38 - 126  U/L   Total Bilirubin 0.4 0.3 - 1.2 mg/dL   GFR calc non Af Amer >60 >60 mL/min   GFR calc Af Amer >60 >60 mL/min   Anion gap 5 5 - 15  I-Stat CG4 Lactic Acid, ED  Result Value Ref Range   Lactic Acid, Venous 1.86 0.5 - 1.9 mmol/L   Dg Hand Complete Right  Result Date: 11/11/2015 CLINICAL DATA:  RIGHT hand laceration after punching window. EXAM: RIGHT HAND - COMPLETE 3+ VIEW COMPARISON:  RIGHT hand radiograph January 08, 2009 FINDINGS: No acute fracture deformity or dislocation. Joint space intact without erosions. No destructive bony lesions. Mild dorsal hand soft tissue swelling without subcutaneous gas or radiopaque foreign bodies. IMPRESSION: Soft tissue swelling without acute osseous process. Electronically Signed   By: Elon Alas M.D.   On: 11/11/2015 06:21   Suicidal ideations  Contusion, hand, right, initial encounter  Superficial laceration  Metabolic acidosis, normal anion gap (NAG)     Waynetta Pean, PA-C 11/11/15 [place], MD 11/11/15 2252

## 2015-11-11 NOTE — ED Notes (Signed)
Patient is resting quietly.  He does not speak [place].  He asked, "Mi casa?"  Asked patient if he was asking when he could go home.  Informed patient that he doctor will have to make that determination.  Patient does understand [place].
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
## 2015-11-11 NOTE — Progress Notes (Signed)
Patient has several scars to body which he has a scar on right shoulder surgery. Patient denies SI, HI, and AVH.   Patient remains safe through constant monitoring with exception of bathroom times. He was offered support, encouragement, and education.   Patient is receptive and compliant, will continue to monitor.   Patient is Spanish-speaking, but understands some [place].

## 2015-11-11 NOTE — ED Notes (Signed)
Patient left ambulatory with [facility] transportation.  Patient is to be transported to [facility] Observation unit for further assessment.

## 2015-11-11 NOTE — ED Notes (Signed)
Please see paper documentation during EPIC downtime. 

## 2015-11-11 NOTE — ED Notes (Signed)
Pt does not speak english.  Cousin at bedside to translate.
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
## 2015-11-11 NOTE — ED Notes (Signed)
TTS in with patient.  
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
## 2015-11-11 NOTE — ED Notes (Signed)
Requested urine sample from patient.  He understands [place], however, cannot speak it.  Awaiting machine for interpreting.  He nodded his head "yes" when asked if he was suicidal.  When asked if he had been drinking, he nodded his "no."  Patient BAL was 101 upon admission.  His appearance is disheveled and hygiene was poor.
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
## 2015-11-12 DIAGNOSIS — F1099 Alcohol use, unspecified with unspecified alcohol-induced disorder: Secondary | ICD-10-CM

## 2015-11-12 MED ORDER — FLUOXETINE HCL 10 MG PO CAPS
10.0000 mg | ORAL_CAPSULE | Freq: Every day | ORAL | Status: DC
  Administered 2015-11-12 – 2015-11-13 (×2): 10 mg via ORAL
  Filled 2015-11-12 (×2): qty 1
  Filled 2015-11-12: qty 7

## 2015-11-12 NOTE — Progress Notes (Signed)
D: Patient calm and cooperative;  Affect flat; mood depressed.  He denies suicidal and homicidal ideation and AVH.  No self-injurious behaviors noted or reported. A:  Medications given as ordered; emotional support provided; encouraged him to seek assistance with needs/concerns. R:  Safety maintained on unit.

## 2015-11-12 NOTE — Progress Notes (Signed)
Patient;s brother called unit and states that patient called him. He said that he did not think patient should be discharged today.  He said that patient drank everyday heavily and that he thought patient also used cocaine because he frequently "acted crazy".  He said that patient also has frequent conflicts with his wife and would like to see patient for outpatient treatment when he does go home.  Vernona Rieger, NP made aware.

## 2015-11-12 NOTE — Progress Notes (Signed)
Patient sleeping and continues to sleep at this time. Patient appears drowsy on many attempts this writer wants to  talk to him. Staff continues to monitor patient for safety via constant observation. Will continue to monitor patient for safety and stability.

## 2015-11-12 NOTE — Progress Notes (Signed)
Patient ID: [name], male   DOB: July 07, 1985, 30 y.o.   MRN: [id number]  D: Patient smiling when awake. Offered drink and snack and he accepted. [name] speaks spanish so minimal with conversation tonight. Previous shift reports he was denying SI and he has made no attempts harm self or others tonight. Sleeping on and off since this shift started. Vital signs stable and no overt s/s of withdrawals observed. Doesn't appear to be having tremors at present. BP- 110/70 and P- 79. Librium ordered if he has some withdrawal symptoms but none noted at present. A: Staff will monitor on q 15 minute checks, follow treatment plan, and give medications as ordered. R: Cooperative in OBS

## 2015-11-12 NOTE — H&P (Signed)
[facility] Provider Admission PAA/H&P  Patient Identification: [name] MRN:  [id number] Date of Evaluation:  11/12/2015 Chief Complaint:  Patient states "I have been depressed for about a while because of problems with my wife."  Principal Diagnosis: <principal problem not specified> Diagnosis:   Patient Active Problem List   Diagnosis Date Noted  . Alcohol use disorder ([place]) [F10.99] 11/11/2015    Priority: High  . Alcohol use disorder, moderate, dependence ([place]) [F10.20] 11/11/2015   History of Present Illness:   [name] is an 30 y.o. male who was brought into the ED by EMS because of suicidal threats, violent outburst, and alcohol intoxication.  According to documentation, patient broke the windows out at his home causing cuts to his extremities and text his mother threatening suicide. His right hand is noted to be bandaged during the assessment.  Patient currently denies suicidal ideations, homicidal ideations, A/VH, and other self-injurious behaviors. He appears to be greatly minimizing his symptoms at this time.   Patient reports current stressors conflicts with ex-wife, inability to see children, and financial problems.  Patient reports substance use includes alcohol, daily, six or more beers, started at age 75, and last used 11/10/2015.  Patient currently pending DUI charges and court date is 11/26/2015.  Patient reports his first DUI 2-3 years ago and completed AA meetings and DUI classes. The Observation staff contacted his brother for collateral information who reported that patient has been drinking excessively and having constant conflicts with his wife. Patient admits to symptoms of depression related to his wife not letting him see his son.    Associated Signs/Symptoms: Depression Symptoms:  depressed mood, insomnia, feelings of worthlessness/guilt, difficulty concentrating, hopelessness, recurrent thoughts of death, suicidal thoughts without  plan, anxiety, (Hypo) Manic Symptoms:  Denies Anxiety Symptoms:  Excessive Worry, Psychotic Symptoms:  Denies PTSD Symptoms: Negative Total Time spent with patient: 30 minutes  Past Psychiatric History: Denies  Is the patient at risk to self? Yes.    Has the patient been a risk to self in the past 6 months? Yes.    Has the patient been a risk to self within the distant past? No.  Is the patient a risk to others? No.  Has the patient been a risk to others in the past 6 months? No.  Has the patient been a risk to others within the distant past? No.   Prior Inpatient Therapy:   Denies Prior Outpatient Therapy:  Denies  Alcohol Screening:   Substance Abuse History in the last 12 months:  Yes.   Consequences of Substance Abuse: Family Consequences:  Drinking has affected relationship with wife.  Blackouts:  yes  Upcoming court date for DUI Previous Psychotropic Medications: No  Psychological Evaluations: No  Past Medical History: History reviewed. No pertinent past medical history.  Past Surgical History:  Procedure Laterality Date  . SHOULDER SURGERY Right    Family History: History reviewed. No pertinent family history. Family Psychiatric History: Denies Tobacco Screening: "I smoke one cigarette a day."  Social History:  History  Alcohol Use  . Yes    Comment: occasssional     History  Drug Use No    Additional Social History:                           Allergies:   Allergies  Allergen Reactions  . Penicillins Itching and Swelling    Has patient had a PCN reaction causing immediate rash, facial/tongue/throat  swelling, SOB or lightheadedness with hypotension:No Has patient had a PCN reaction causing severe rash involving mucus membranes or skin necrosis:No Has patient had a PCN reaction that required hospitalization:No Has patient had a PCN reaction occurring within the last 10 years:No If all of the above answers are "NO", then may proceed with  Cephalosporin use.    Lab Results:  Results for orders placed or performed during the hospital encounter of 11/11/15 (from the past 48 hour(s))  Comprehensive metabolic panel     Status: Abnormal   Collection Time: 11/11/15 12:23 AM  Result Value Ref Range   Sodium 141 135 - 145 mmol/L   Potassium 2.9 (L) 3.5 - 5.1 mmol/L   Chloride 115 (H) 101 - 111 mmol/L   CO2 13 (L) 22 - 32 mmol/L   Glucose, Bld 100 (H) 65 - 99 mg/dL   BUN 10 6 - 20 mg/dL   Creatinine, Ser 1.04 0.61 - 1.24 mg/dL   Calcium 8.5 (L) 8.9 - 10.3 mg/dL   Total Protein 7.2 6.5 - 8.1 g/dL   Albumin 4.0 3.5 - 5.0 g/dL   AST 20 15 - 41 U/L   ALT 14 (L) 17 - 63 U/L   Alkaline Phosphatase 80 38 - 126 U/L   Total Bilirubin 0.2 (L) 0.3 - 1.2 mg/dL    Comment: REPEATED TO VERIFY   GFR calc non Af Amer >60 >60 mL/min   GFR calc Af Amer >60 >60 mL/min    Comment: (NOTE) The eGFR has been calculated using the CKD EPI equation. This calculation has not been validated in all clinical situations. eGFR's persistently <60 mL/min signify possible Chronic Kidney Disease.    Anion gap 13 5 - 15  Ethanol     Status: Abnormal   Collection Time: 11/11/15 12:23 AM  Result Value Ref Range   Alcohol, Ethyl (B) 101 (H) <5 mg/dL    Comment:        LOWEST DETECTABLE LIMIT FOR SERUM ALCOHOL IS 5 mg/dL FOR MEDICAL PURPOSES ONLY   Salicylate level     Status: None   Collection Time: 11/11/15 12:23 AM  Result Value Ref Range   Salicylate Lvl <6.7 2.8 - 30.0 mg/dL  Acetaminophen level     Status: Abnormal   Collection Time: 11/11/15 12:23 AM  Result Value Ref Range   Acetaminophen (Tylenol), Serum <10 (L) 10 - 30 ug/mL    Comment:        THERAPEUTIC CONCENTRATIONS VARY SIGNIFICANTLY. A RANGE OF 10-30 ug/mL MAY BE AN EFFECTIVE CONCENTRATION FOR MANY PATIENTS. HOWEVER, SOME ARE BEST TREATED AT CONCENTRATIONS OUTSIDE THIS RANGE. ACETAMINOPHEN CONCENTRATIONS >150 ug/mL AT 4 HOURS AFTER INGESTION AND >50 ug/mL AT 12 HOURS AFTER  INGESTION ARE OFTEN ASSOCIATED WITH TOXIC REACTIONS.   cbc     Status: Abnormal   Collection Time: 11/11/15 12:23 AM  Result Value Ref Range   WBC 6.5 4.0 - 10.5 K/uL   RBC 4.33 4.22 - 5.81 MIL/uL   Hemoglobin 13.1 13.0 - 17.0 g/dL   HCT 37.1 (L) 39.0 - 52.0 %   MCV 85.7 78.0 - 100.0 fL   MCH 30.3 26.0 - 34.0 pg   MCHC 35.3 30.0 - 36.0 g/dL   RDW 13.2 11.5 - 15.5 %   Platelets 318 150 - 400 K/uL  I-Stat CG4 Lactic Acid, ED     Status: None   Collection Time: 11/11/15  3:02 AM  Result Value Ref Range   Lactic Acid, Venous 1.86 0.5 -  1.9 mmol/L  Ethylene glycol     Status: None   Collection Time: 11/11/15  3:33 AM  Result Value Ref Range   Ethylene Glycol Lvl NONE DETECTED <5 mg/dL    Comment: Performed at [facility] (acetone,ethanol,isoprop,methanol)     Status: None   Collection Time: 11/11/15  3:33 AM  Result Value Ref Range   Acetone, blood NEGATIVE     Comment: Performed at [facility]   Ethanol, blood 0.040     Comment: REFERENCE RANGE 0.000 TO 0.010 Performed at [facility]    Isopropanol, blood NEGATIVE     Comment: Performed at [facility]   Methanol, blood NEGATIVE     Comment: Performed at [place] metabolic panel     Status: Abnormal   Collection Time: 11/11/15  6:48 AM  Result Value Ref Range   Sodium 141 135 - 145 mmol/L   Potassium 3.2 (L) 3.5 - 5.1 mmol/L   Chloride 117 (H) 101 - 111 mmol/L   CO2 19 (L) 22 - 32 mmol/L   Glucose, Bld 104 (H) 65 - 99 mg/dL   BUN 8 6 - 20 mg/dL   Creatinine, Ser 0.81 0.61 - 1.24 mg/dL   Calcium 8.0 (L) 8.9 - 10.3 mg/dL   Total Protein 6.3 (L) 6.5 - 8.1 g/dL   Albumin 3.7 3.5 - 5.0 g/dL   AST 15 15 - 41 U/L   ALT 13 (L) 17 - 63 U/L   Alkaline Phosphatase 74 38 - 126 U/L   Total Bilirubin 0.4 0.3 - 1.2 mg/dL   GFR calc non Af Amer >60 >60 mL/min   GFR calc Af Amer >60 >60 mL/min    Comment: (NOTE) The eGFR has been calculated using the CKD EPI  equation. This calculation has not been validated in all clinical situations. eGFR's persistently <60 mL/min signify possible Chronic Kidney Disease.    Anion gap 5 5 - 15    Blood Alcohol level:  Lab Results  Component Value Date   ETH 101 (H) 11/11/2015   ETH 425 (HH) 45/99/7741    Metabolic Disorder Labs:  No results found for: HGBA1C, MPG No results found for: PROLACTIN No results found for: CHOL, TRIG, HDL, CHOLHDL, VLDL, LDLCALC  Current Medications: Current Facility-Administered Medications  Medication Dose Route Frequency Provider Last Rate Last Dose  . acetaminophen (TYLENOL) tablet 650 mg  650 mg Oral Q6H PRN Shuvon B Rankin, NP      . alum & mag hydroxide-simeth (MAALOX/MYLANTA) 200-200-20 MG/5ML suspension 30 mL  30 mL Oral Q4H PRN Shuvon B Rankin, NP      . chlordiazePOXIDE (LIBRIUM) capsule 25 mg  25 mg Oral Q6H PRN Shuvon B Rankin, NP      . hydrOXYzine (ATARAX/VISTARIL) tablet 25 mg  25 mg Oral Q6H PRN Shuvon B Rankin, NP      . loperamide (IMODIUM) capsule 2-4 mg  2-4 mg Oral PRN Shuvon B Rankin, NP      . magnesium hydroxide (MILK OF MAGNESIA) suspension 30 mL  30 mL Oral Daily PRN Shuvon B Rankin, NP      . multivitamin with minerals tablet 1 tablet  1 tablet Oral Daily Shuvon B Rankin, NP   1 tablet at 11/12/15 0818  . ondansetron (ZOFRAN-ODT) disintegrating tablet 4 mg  4 mg Oral Q6H PRN Shuvon B Rankin, NP      . thiamine (VITAMIN B-1) tablet 100 mg  100 mg Oral Daily Shuvon B Rankin, NP  100 mg at 11/12/15 0818  . traZODone (DESYREL) tablet 50 mg  50 mg Oral QHS PRN Shuvon B Rankin, NP       PTA Medications: No prescriptions prior to admission.    Musculoskeletal: Strength & Muscle Tone: within normal limits Gait & Station: normal Patient leans: N/A  Psychiatric Specialty Exam: Physical Exam  Review of Systems  Psychiatric/Behavioral: Positive for depression, substance abuse and suicidal ideas. The patient is nervous/anxious.     Blood pressure  115/74, pulse 84, temperature 98.4 F (36.9 C), temperature source Oral, resp. rate 16, SpO2 100 %.There is no height or weight on file to calculate BMI.  General Appearance: Disheveled  Eye Contact:  Fair  Speech:  Clear and Coherent  Volume:  Normal  Mood:  Depressed  Affect:  Congruent  Thought Process:  Coherent  Orientation:  Full (Time, Place, and Person)  Thought Content:  Symptoms, worries, concerns   Suicidal Thoughts:  No Denies but was reported on admission per collateral information   Homicidal Thoughts:  No  Memory:  Immediate;   Good Recent;   Fair Remote;   Fair  Judgement:  Poor  Insight:  Lacking  Psychomotor Activity:  Normal  Concentration:  Concentration: Good and Attention Span: Good  Recall:  Good  Fund of Knowledge:  Good  Language:  Good  Akathisia:  No  Handed:  Right  AIMS (if indicated):     Assets:  Communication Skills Desire for Improvement Leisure Time Physical Health Resilience Social Support  ADL's:  Intact  Cognition:  WNL  Sleep:         Treatment Plan Summary: Daily contact with patient to assess and evaluate symptoms and progress in treatment and Medication management  Observation Level/Precautions:  Continuous Observation Laboratory:  CBC Chemistry Profile UDS Psychotherapy:  Individual  Medications:  Librium 25 mg every six hours prn symptoms of alcohol withdrawal, Start Prozac 10 mg daily for depression  Consultations:  None Discharge Concerns:  Continued alcohol abuse Estimated LOS: 24-48 hours Other:  Referrals for residential treatment for alcohol abuse    Elmarie Shiley, NP 7/24/201712:01 PM

## 2015-11-13 MED ORDER — TRAZODONE HCL 50 MG PO TABS: 50.0000 mg | ORAL_TABLET | Freq: Every evening | ORAL | 0 refills | Status: AC | PRN

## 2015-11-13 MED ORDER — FLUOXETINE HCL 10 MG PO CAPS: 10.0000 mg | ORAL_CAPSULE | Freq: Every day | ORAL | 0 refills | Status: AC

## 2015-11-13 MED ORDER — ADULT MULTIVITAMIN W/MINERALS CH: 1.0000 | ORAL_TABLET | Freq: Every day | ORAL | Status: AC

## 2015-11-13 NOTE — Discharge Instructions (Signed)
Usted necesita ir la clinica que se llama "[facility]".  Debe ir el miercoles a las 9:00 a.m. para una evaluacion.  Debe llevar consigo una tarjeta de identificacion valida. Si tiene un seguro medico debe darle esta informacion a la Musician.    Dirrecion:  [address], [address] [place], [place]  Numero [place]:  [phone]

## 2015-11-13 NOTE — Discharge Summary (Signed)
[facility] Discharge Summary Note  Patient:  [name] is an 30 y.o., male MRN:  [id number] DOB:  [birth year] Patient phone:  [phone] (home)  Patient address:   [address] [place] [address],  Total Time spent with patient: 30 minutes  Date of Admission:  11/11/2015 Date of Discharge: 11/13/2015  Reason for Admission:  Alcohol abuse, Depression  Principal Problem: <principal problem not specified> Discharge Diagnoses: Patient Active Problem List   Diagnosis Date Noted  . Alcohol use disorder ([facility]) [F10.99] 11/11/2015    Priority: High  . Alcohol use disorder, moderate, dependence ([facility]) [F10.20] 11/11/2015    Past Psychiatric History: Denied  Past Medical History: History reviewed. No pertinent past medical history.  Past Surgical History:  Procedure Laterality Date  . SHOULDER SURGERY Right    Family History: History reviewed. No pertinent family history. Family Psychiatric  History: See H & P Social History:  History  Alcohol Use  . Yes    Comment: occasssional     History  Drug Use No    Social History   Social History  . Marital status: Married    Spouse name: N/A  . Number of children: N/A  . Years of education: N/A   Social History Main Topics  . Smoking status: Never Smoker  . Smokeless tobacco: Never Used  . Alcohol use Yes     Comment: occasssional  . Drug use: No  . Sexual activity: Not Currently    Partners: Female   Other Topics Concern  . None   Social History Narrative  . None    Hospital Course:    [name] an 30 y.o.male who was brought into the ED by EMS because of suicidal threats, violent outburst, and alcohol intoxication. According to documentation, patient broke the windows out at his home causing cuts to his extremities and text his mother threatening suicide. His right hand is noted to be bandaged during the assessment. Patient currently denies suicidal ideations, homicidal ideations,  A/VH, and other self-injurious behaviors. He appears to be greatly minimizing his symptoms at this time.  Patient reports current stressors conflicts with ex-wife, inability to see children, and financial problems. Patient reports substance use includes alcohol, daily, six or more beers, started at age 61, and last used 11/10/2015. Patient currently pending DUI charges and court date is 11/26/2015. Patient reports his first DUI 2-3 years ago and completed AA meetings and DUI classes. The Observation staff contacted his brother for collateral information who reported that patient has been drinking excessively and having constant conflicts with his wife. Patient admits to symptoms of depression related to his wife not letting him see his son.   Patient was monitored in the [facility]. He was started on Prozac 10 mg daily for management of depression. Patient denied any further suicidal ideation. The patient appeared to minimize his alcohol use according to collateral information obtained from brother. The patient was encouraged to obtain help for alcohol abuse after discharge. [name] was calm and cooperative during his Observation Unit admission. He was medication compliant and denied any side effects. [name] was found stable to discharge home on 11/13/2015. The Observation Unit counselor provided resources for outpatient treatment on [facility] [place]. He agreed to follow up at [facility] in the morning (11/14/2015) @ 9am. Patient was provided with address and telephone number to [facility]. Patient reports that his wife will pick him up post discharge. Patient will be discharged with prescriptions and  samples upon discharge.  Patient denied SI/HI/AHV at time of discharge from [facility]. There were no signs or symptoms of alcohol withdrawal noted.   Physical Findings: AIMS: Facial and Oral Movements Muscles of Facial Expression: None, normal Lips and  Perioral Area: None, normal Jaw: None, normal Tongue: None, normal,Extremity Movements Upper (arms, wrists, hands, fingers): None, normal Lower (legs, knees, ankles, toes): None, normal, Trunk Movements Neck, shoulders, hips: None, normal, Overall Severity Severity of abnormal movements (highest score from questions above): None, normal Incapacitation due to abnormal movements: None, normal Patient's awareness of abnormal movements (rate only patient's report): No Awareness, Dental Status Current problems with teeth and/or dentures?: No Does patient usually wear dentures?: No  CIWA:  CIWA-Ar Total: 0 COWS:  COWS Total Score: 2  Musculoskeletal: Strength & Muscle Tone: within normal limits Gait & Station: normal Patient leans: N/A  Psychiatric Specialty Exam: Physical Exam  Review of Systems  Psychiatric/Behavioral: Positive for substance abuse.    Blood pressure 103/67, pulse 88, temperature 98.2 F (36.8 C), temperature source Oral, resp. rate 17, height 5' 4.96" (1.65 m), weight 84.3 kg (185 lb 13.6 oz), SpO2 100 %.Body mass index is 30.96 kg/m.  General Appearance: Casual  Eye Contact:  Good  Speech:  Clear and Coherent  Volume:  Normal  Mood:  Euthymic  Affect:  Appropriate  Thought Process:  Coherent and Goal Directed  Orientation:  Full (Time, Place, and Person)  Thought Content:  WDL  Suicidal Thoughts:  No  Homicidal Thoughts:  No  Memory:  Immediate;   Good Recent;   Good Remote;   Good  Judgement:  Fair  Insight:  Shallow  Psychomotor Activity:  Normal  Concentration:  Concentration: Good and Attention Span: Good  Recall:  Good  Fund of Knowledge:  Good  Language:  Good  Akathisia:  No  Handed:  Right  AIMS (if indicated):     Assets:  Communication Skills Desire for Improvement Housing Leisure Time Physical Health Resilience Social Support  ADL's:  Intact  Cognition:  WNL  Sleep:           Has this patient used any form of tobacco in the  last 30 days? (Cigarettes, Smokeless Tobacco, Cigars, and/or Pipes) Yes, No  Blood Alcohol level:  Lab Results  Component Value Date   ETH 101 (H) 11/11/2015   ETH 425 (HH) 01/30/2014    Metabolic Disorder Labs:  No results found for: HGBA1C, MPG No results found for: PROLACTIN No results found for: CHOL, TRIG, HDL, CHOLHDL, VLDL, LDLCALC  Discharge destination:  Home  Is patient on multiple antipsychotic therapies at discharge:  No   Has Patient had three or more failed trials of antipsychotic monotherapy by history:  No  Recommended Plan for Multiple Antipsychotic Therapies: NA     Medication List    TAKE these medications     Indication  FLUoxetine 10 MG capsule Commonly known as:  PROZAC Take 1 capsule (10 mg total) by mouth daily.  Indication:  Excessive Use of Alcohol, Depression   multivitamin with minerals Tabs tablet Take 1 tablet by mouth daily.  Indication:  Vitamin Supplementation   traZODone 50 MG tablet Commonly known as:  DESYREL Take 1 tablet (50 mg total) by mouth at bedtime as needed for sleep.  Indication:  Trouble Sleeping        Follow-up recommendations:    As above   Comments:   Take all your medications as prescribed by your mental healthcare provider.  Report  any adverse effects and or reactions from your medicines to your outpatient provider promptly.  Patient is instructed and cautioned to not engage in alcohol and or illegal drug use while on prescription medicines.  In the event of worsening symptoms, patient is instructed to call the crisis hotline, 911 and or go to the nearest ED for appropriate evaluation and treatment of symptoms.  Follow-up with your primary care provider for your other medical issues, concerns and or health care needs.   SignedFransisca Kaufmann, NP 11/13/2015, 3:38 PM

## 2015-11-13 NOTE — [facility] Counselor (Signed)
OBS Counselor made telephone calls to the following facilities ([facility] and [facility]) for residential placement. This writer left a detailed message at both facilities requesting a return call.  Ardelle Park, MA OBS Counselor

## 2015-11-13 NOTE — [facility] Counselor (Signed)
This Clinical research associate spoke with patient in regards to treatment and patient decided that he would like resources for OPT instead of residential treatment. This Clinical research associate provided patient with information on [facility] in [place]. This Clinical research associate informed patient of walk-in availability. This patient was receptive to the information presented to him and verbally agreed to follow up at [facility] in the morning (11/14/2015) @ 9am. Pt was provided with address and telephone number to [facility]. Pt reports that his wife will pick him up post discharge on today. Pt will be discharged with prescriptions and samples upon discharge.  Pt denies SI/HI/AHV at this time.  Ardelle Park, MA OBS Counselor

## 2015-11-13 NOTE — Progress Notes (Signed)
D:  Patient awake and alert; affect appropriate to content; mood pleasant; No signs/symtpoms of withdrawal noted;  Denies suicidal and homicidal ideation and AVH;  No self-injurious behaviors noted or reported. Denies other concerns. A:  Scheduled medications given as ordered; emotional support provided; encouraged him to seek assistance with needs/concerns. R:  Safety maintained on unit.

## 2015-11-13 NOTE — Progress Notes (Signed)
[place]/verbal instructions given with verbalization of understanding.  Patient given prescriptions, sample medications and follow-up appointment information.  He denies suicidal and homicidal ideation and AVH;  Discharged home with family in stable condition.  Denies concerns at time of discharge.

## 2016-08-06 ENCOUNTER — Ambulatory Visit (INDEPENDENT_AMBULATORY_CARE_PROVIDER_SITE_OTHER): Payer: Self-pay | Admitting: Emergency Medicine

## 2016-08-06 VITALS — BP 108/70 | HR 70 | Temp 98.9°F | Resp 16 | Ht 67.25 in | Wt 198.0 lb

## 2016-08-06 DIAGNOSIS — IMO0002 Reserved for concepts with insufficient information to code with codable children: Secondary | ICD-10-CM

## 2016-08-06 DIAGNOSIS — F1099 Alcohol use, unspecified with unspecified alcohol-induced disorder: Secondary | ICD-10-CM

## 2016-08-06 DIAGNOSIS — Z0000 Encounter for general adult medical examination without abnormal findings: Secondary | ICD-10-CM | POA: Insufficient documentation

## 2016-08-06 NOTE — Patient Instructions (Signed)
     IF you received an x-ray today, you will receive an invoice from [facility]. Please contact  Radiology at [phone] with questions or concerns regarding your invoice.   IF you received labwork today, you will receive an invoice from [facility]. Please contact [facility] at [phone] with questions or concerns regarding your invoice.   Our billing staff will not be able to assist you with questions regarding bills from these companies.  You will be contacted with the lab results as soon as they are available. The fastest way to get your results is to activate your My Chart account. Instructions are located on the last page of this paperwork. If you have not heard from us regarding the results in 2 weeks, please contact this office.     

## 2016-08-06 NOTE — Progress Notes (Signed)
[name] 31 y.o.   Chief Complaint  Patient presents with  . Other    Needs form completed    HISTORY OF PRESENT ILLNESS: This is a 31 y.o. male needs physical exam and form filled out to get into alcohol rehab facility.  HPI   Prior to Admission medications   Medication Sig Start Date End Date Taking? Authorizing Provider  FLUoxetine (PROZAC) 10 MG capsule Take 1 capsule (10 mg total) by mouth daily. Patient not taking: Reported on 08/06/2016 11/13/15   Thermon Leyland, NP  Multiple Vitamin (MULTIVITAMIN WITH MINERALS) TABS tablet Take 1 tablet by mouth daily. Patient not taking: Reported on 08/06/2016 11/13/15   Thermon Leyland, NP  traZODone (DESYREL) 50 MG tablet Take 1 tablet (50 mg total) by mouth at bedtime as needed for sleep. Patient not taking: Reported on 08/06/2016 11/13/15   Thermon Leyland, NP    Allergies  Allergen Reactions  . Penicillins Itching and Swelling    Has patient had a PCN reaction causing immediate rash, facial/tongue/throat swelling, SOB or lightheadedness with hypotension:No Has patient had a PCN reaction causing severe rash involving mucus membranes or skin necrosis:No Has patient had a PCN reaction that required hospitalization:No Has patient had a PCN reaction occurring within the last 10 years:No If all of the above answers are "NO", then may proceed with Cephalosporin use.     Patient Active Problem List   Diagnosis Date Noted  . Alcohol use disorder, moderate, dependence (HCC) 11/11/2015  . Alcohol use disorder (HCC) 11/11/2015    No past medical history on file.  Past Surgical History:  Procedure Laterality Date  . SHOULDER SURGERY Right     Social History   Social History  . Marital status: Married    Spouse name: N/A  . Number of children: N/A  . Years of education: N/A   Occupational History  . Not on file.   Social History Main Topics  . Smoking status: Current Every Day Smoker  . Smokeless tobacco: Never Used  .  Alcohol use 2.4 oz/week    4 Cans of beer per week     Comment: occasssional  . Drug use: No  . Sexual activity: Not Currently    Partners: Female   Other Topics Concern  . Not on file   Social History Narrative  . No narrative on file    No family history on file.   Review of Systems  Constitutional: Negative.  Negative for chills, fever and malaise/fatigue.  HENT: Negative.  Negative for nosebleeds and sore throat.   Eyes: Negative for blurred vision and double vision.  Respiratory: Negative.  Negative for cough, shortness of breath and wheezing.   Cardiovascular: Negative.  Negative for chest pain, palpitations and leg swelling.  Gastrointestinal: Negative.  Negative for abdominal pain, nausea and vomiting.  Genitourinary: Negative.  Negative for dysuria and hematuria.  Musculoskeletal: Negative.  Negative for back pain, myalgias and neck pain.  Skin: Negative.  Negative for rash.  Neurological: Negative.  Negative for dizziness, weakness and headaches.  Endo/Heme/Allergies: Negative.   Psychiatric/Behavioral: Negative.   All other systems reviewed and are negative.    Vitals:   08/06/16 1456  BP: 108/70  Pulse: 70  Resp: 16  Temp: 98.9 F (37.2 C)     Physical Exam  Constitutional: He is oriented to person, place, and time. He appears well-developed and well-nourished.  HENT:  Head: Normocephalic and atraumatic.  Nose: Nose normal.  Mouth/Throat: Oropharynx is  clear and moist. No oropharyngeal exudate.  Eyes: Conjunctivae and EOM are normal. Pupils are equal, round, and reactive to light.  Neck: Normal range of motion. Neck supple. No JVD present. No thyromegaly present.  Cardiovascular: Normal rate, regular rhythm, normal heart sounds and intact distal pulses.   Pulmonary/Chest: Effort normal and breath sounds normal.  Abdominal: Soft. Bowel sounds are normal. He exhibits no distension. There is no tenderness.  Musculoskeletal: Normal range of motion.   Lymphadenopathy:    He has no cervical adenopathy.  Neurological: He is alert and oriented to person, place, and time. No sensory deficit. He exhibits normal muscle tone. Coordination normal.  Skin: Skin is warm and dry. Capillary refill takes less than 2 seconds. No rash noted.  Psychiatric: He has a normal mood and affect. His behavior is normal.  Vitals reviewed.    ASSESSMENT & PLAN: [name] was seen today for other.  Diagnoses and all orders for this visit:  Routine general medical examination at a health care facility  Alcohol use disorder (HCC)    Form filled out.   Edwina Barth, MD [facility]

## 2018-02-06 IMAGING — DX DG HAND COMPLETE 3+V*R*
3 series · 3 of 3 positions shown · non-contrast
Comparison: RIGHT hand radiograph January 08, 2009

CLINICAL DATA: RIGHT hand laceration after punching window.

EXAM:
RIGHT HAND - COMPLETE 3+ VIEW

[hand pa]
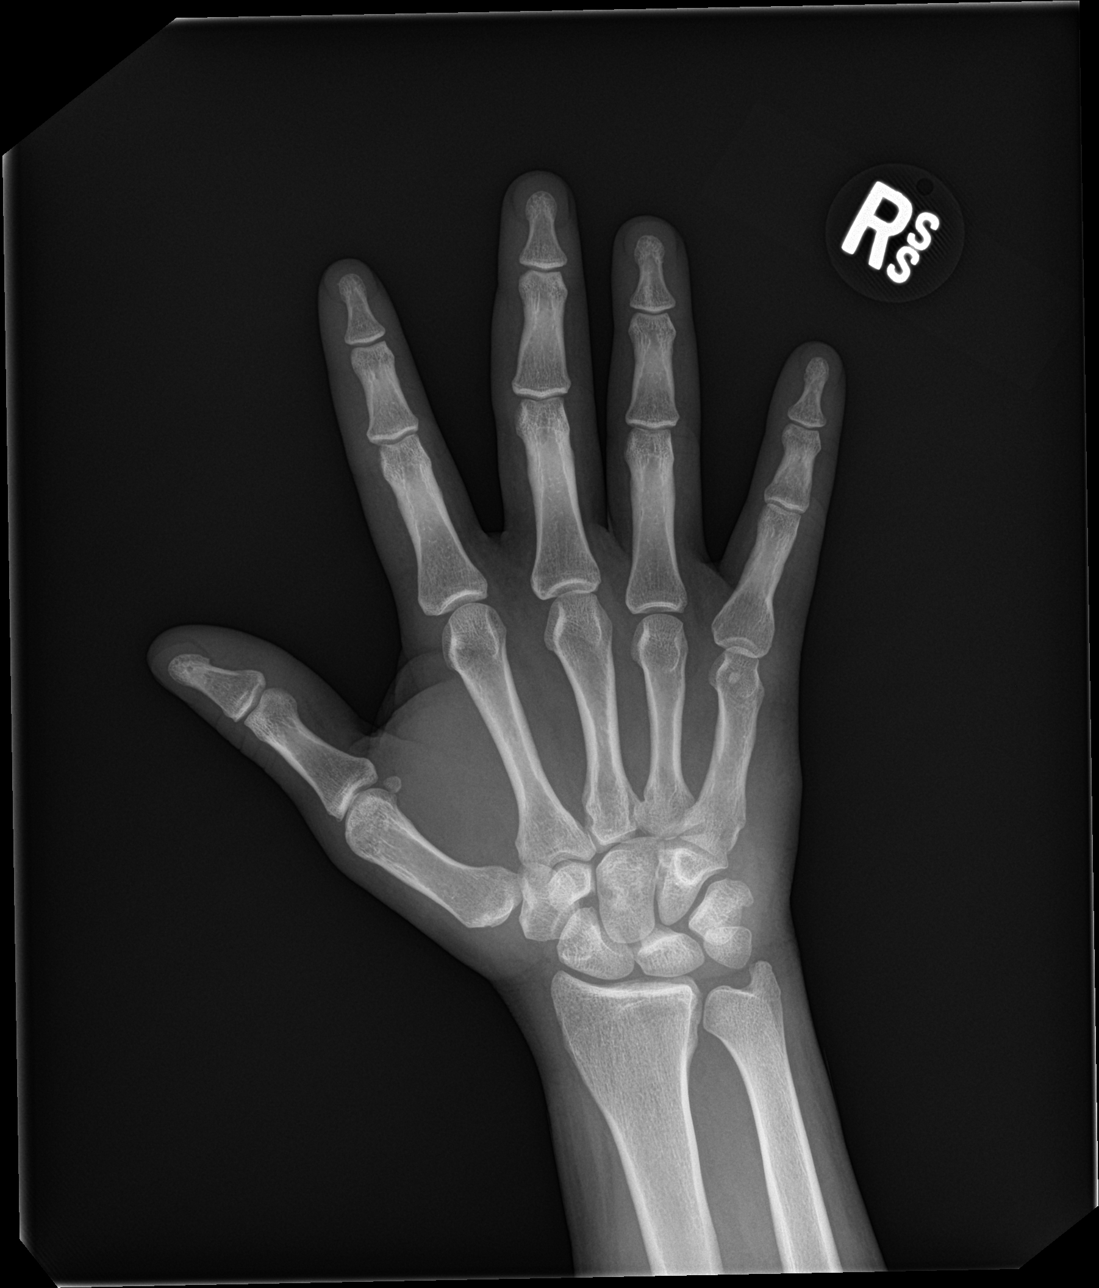

[hand lat]
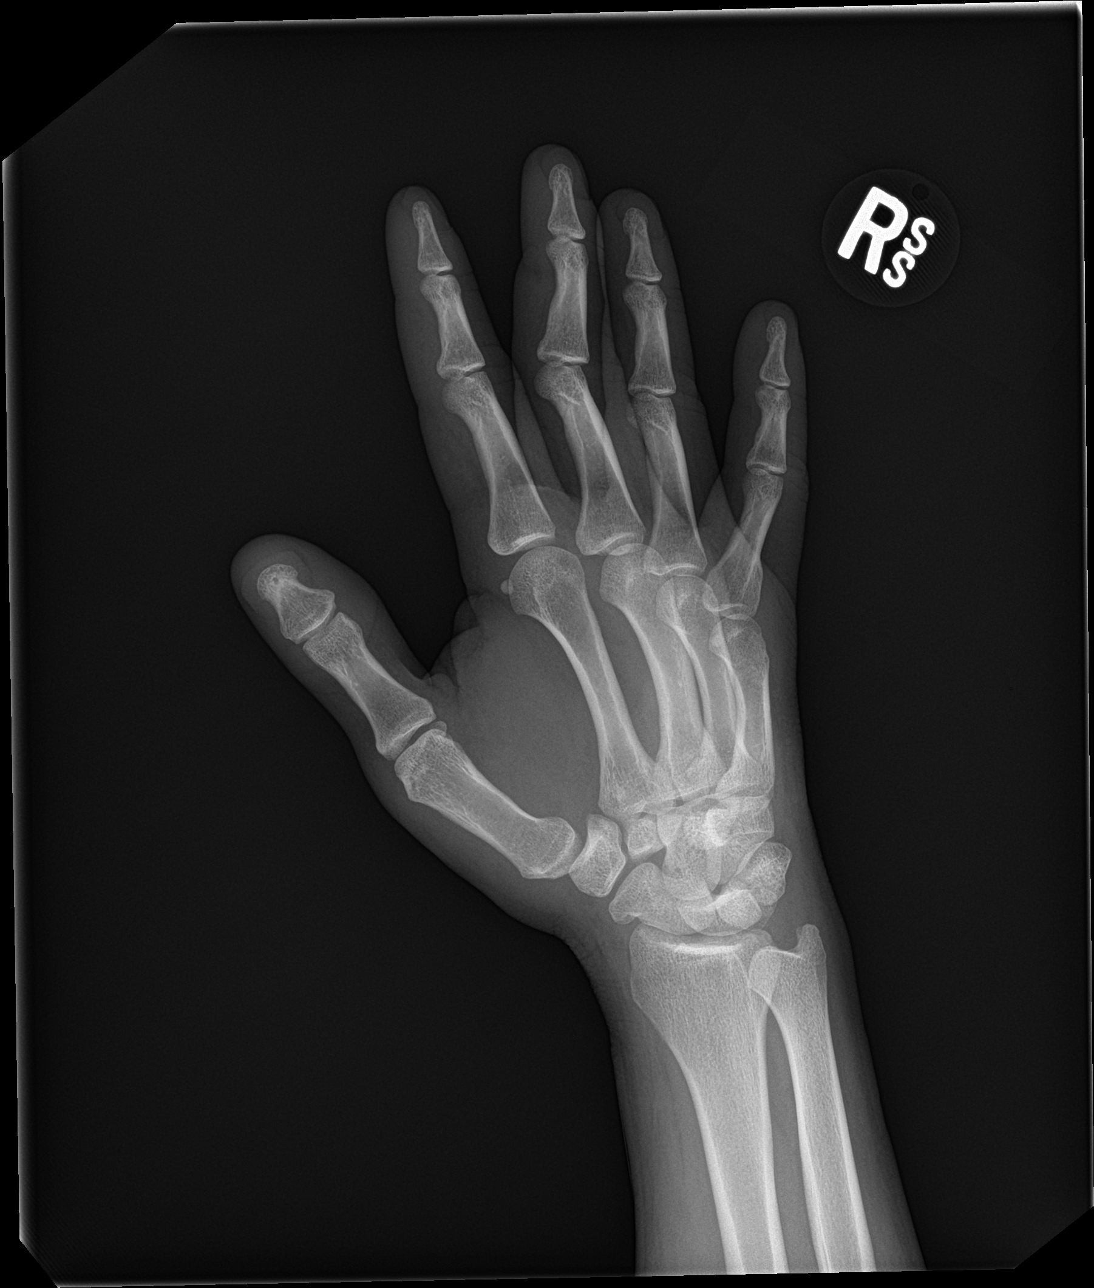

[hand obl]
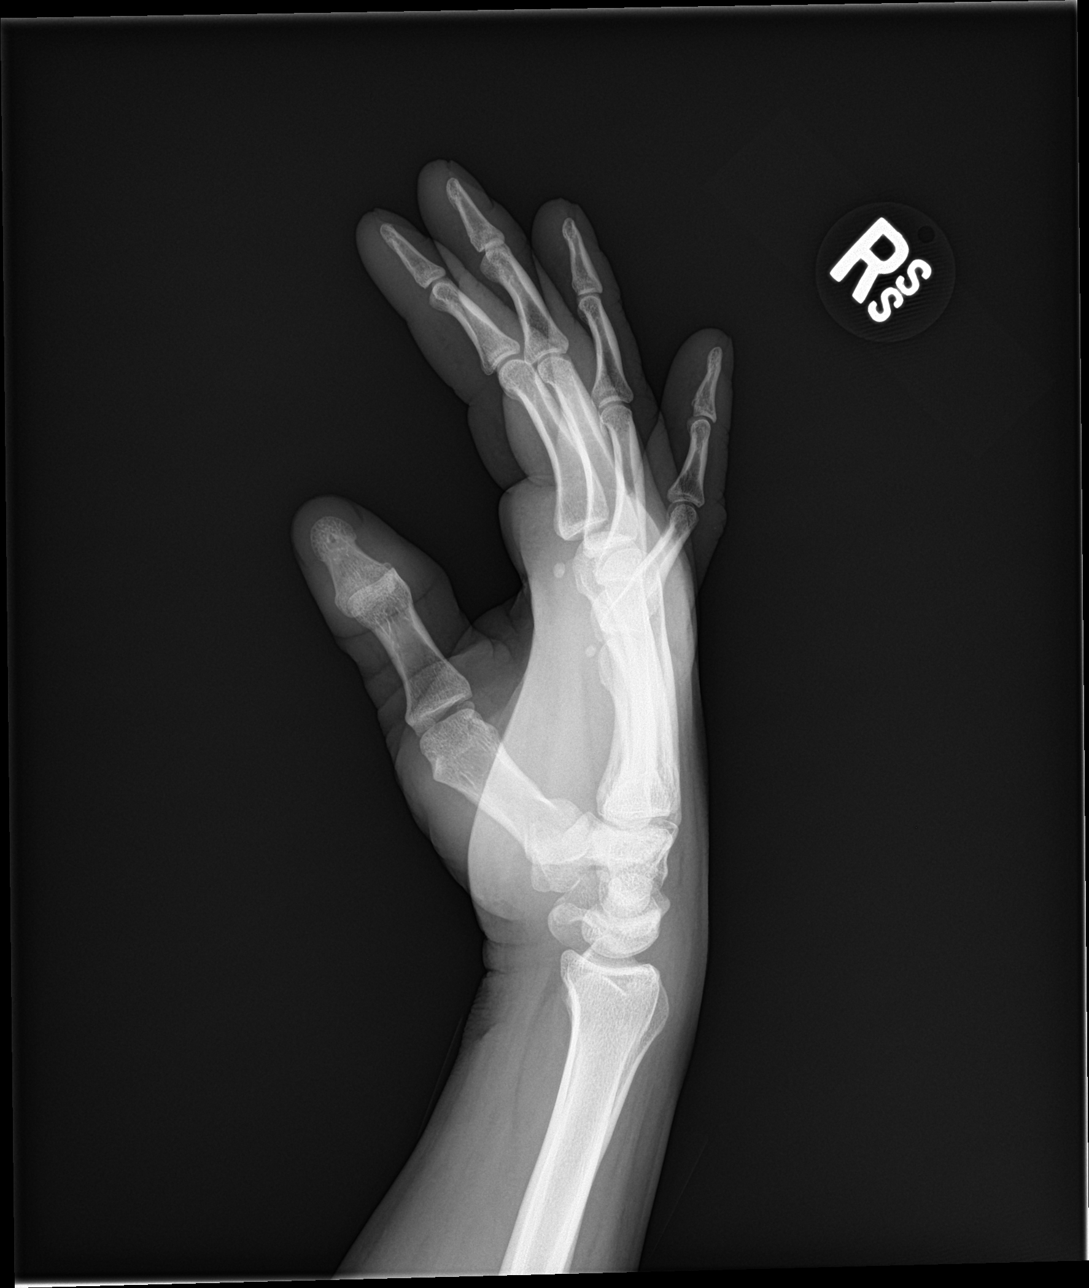

[3 of 3 positions shown; findings below may reference images not displayed]

FINDINGS: No acute fracture deformity or dislocation. Joint space intact
without erosions. No destructive bony lesions. Mild dorsal hand soft
tissue swelling without subcutaneous gas or radiopaque foreign
bodies.
IMPRESSION: Soft tissue swelling without acute osseous process.
